# Patient Record
Sex: Male | Born: 1995 | Race: Black or African American | Hispanic: No | Marital: Single | Smoking: Never smoker
Health system: Southern US, Community
[De-identification: ages and names within clinical notes are randomized; demographics above are authoritative.]

---

## 2018-05-29 ENCOUNTER — Emergency Department (HOSPITAL_COMMUNITY)
Admission: EM | Admit: 2018-05-29 | Discharge: 2018-05-29 | Disposition: A | Discharge status: Home / Self Care | Payer: Self-pay | Attending: Emergency Medicine | Admitting: Emergency Medicine

## 2018-05-29 ENCOUNTER — Encounter (HOSPITAL_COMMUNITY): Payer: Self-pay

## 2018-05-29 ENCOUNTER — Emergency Department (HOSPITAL_COMMUNITY): Payer: Self-pay

## 2018-05-29 DIAGNOSIS — K219 Gastro-esophageal reflux disease without esophagitis: Secondary | ICD-10-CM | POA: Insufficient documentation

## 2018-05-29 DIAGNOSIS — R079 Chest pain, unspecified: Secondary | ICD-10-CM

## 2018-05-29 MED ORDER — RANITIDINE HCL 150 MG PO TABS: 150.0000 mg | ORAL_TABLET | Freq: Two times a day (BID) | ORAL | 0 refills | Status: DC

## 2018-05-29 MED ORDER — GI COCKTAIL ~~LOC~~
30.0000 mL | Freq: Once | ORAL | Status: AC
  Administered 2018-05-29: 30 mL via ORAL
  Filled 2018-05-29: qty 30

## 2018-05-29 NOTE — ED Provider Notes (Signed)
[facility]-EMERGENCY DEPT Provider Note   CSN: [id number] Arrival date & time: 05/29/18  0031     History   Chief Complaint Chief Complaint  Patient presents with  . knot on chest    HPI Jay Wang is a 23 y.o. male.  Patient presents to the ER for evaluation of chest discomfort.  Patient reports that for the last couple of days he has had a sensation of feeling like there is a knot or a bubble in his chest.  Sometimes he feels as when he eats or drinks, feels like the food is not going down completely.  Symptoms come and go.  No shortness of breath.  He did try Pepto-Bismol and Tums without improvement.  He does not have a history of peptic ulcer disease or GERD.     History reviewed. No pertinent past medical history.  There are no active problems to display for this patient.   History reviewed. No pertinent surgical history.      Home Medications    Prior to Admission medications   Medication Sig Start Date End Date Taking? Authorizing Provider  ranitidine (ZANTAC) 150 MG tablet Take 1 tablet (150 mg total) by mouth 2 (two) times daily. 05/29/18   Gilda Crease, MD    Family History History reviewed. No pertinent family history.  Social History Social History   Tobacco Use  . Smoking status: Never Smoker  . Smokeless tobacco: Never Used  Substance Use Topics  . Alcohol use: Never    Frequency: Never  . Drug use: Never     Allergies   Patient has no allergy information on record.   Review of Systems Review of Systems  Respiratory: Negative for cough and shortness of breath.   Cardiovascular: Positive for chest pain.  Gastrointestinal: Negative for abdominal pain.  All other systems reviewed and are negative.    Physical Exam Updated Vital Signs BP (!) 151/89 (BP Location: Left Arm)   Pulse 84   Temp 98.3 F (36.8 C) (Oral)   Resp 16   SpO2 100%   Physical Exam  Constitutional: He is oriented to  person, place, and time. He appears well-developed and well-nourished. No distress.  HENT:  Head: Normocephalic and atraumatic.  Right Ear: Hearing normal.  Left Ear: Hearing normal.  Nose: Nose normal.  Mouth/Throat: Oropharynx is clear and moist and mucous membranes are normal.  Eyes: Pupils are equal, round, and reactive to light. Conjunctivae and EOM are normal.  Neck: Normal range of motion. Neck supple.  Cardiovascular: Regular rhythm, S1 normal and S2 normal. Exam reveals no gallop and no friction rub.  No murmur heard. Pulmonary/Chest: Effort normal and breath sounds normal. No respiratory distress. He exhibits no tenderness.  Abdominal: Soft. Normal appearance and bowel sounds are normal. There is no hepatosplenomegaly. There is no tenderness. There is no rebound, no guarding, no tenderness at McBurney's point and negative Murphy's sign. No hernia.  Musculoskeletal: Normal range of motion.  Neurological: He is alert and oriented to person, place, and time. He has normal strength. No cranial nerve deficit or sensory deficit. Coordination normal. GCS eye subscore is 4. GCS verbal subscore is 5. GCS motor subscore is 6.  Skin: Skin is warm, dry and intact. No rash noted. No cyanosis.  Psychiatric: He has a normal mood and affect. His speech is normal and behavior is normal. Thought content normal.  Nursing note and vitals reviewed.    ED Treatments / Results  Labs (  all labs ordered are listed, but only abnormal results are displayed) Labs Reviewed - No data to display  EKG None  Radiology Dg Chest 2 View  Result Date: 05/29/2018 CLINICAL DATA:  Acute onset of mid chest discomfort. EXAM: CHEST - 2 VIEW COMPARISON:  None. FINDINGS: The lungs are well-aerated and clear. There is no evidence of focal opacification, pleural effusion or pneumothorax. The heart is normal in size; the mediastinal contour is within normal limits. No acute osseous abnormalities are seen. IMPRESSION: No  acute cardiopulmonary process seen. Electronically Signed   By: Roanna Raider M.D.   On: 05/29/2018 02:21    Procedures Procedures (including critical care time)  Medications Ordered in ED Medications  gi cocktail (Maalox,Lidocaine,Donnatal) (30 mLs Oral Given 05/29/18 0217)     Initial Impression / Assessment and Plan / ED Course  I have reviewed the triage vital signs and the nursing notes.  Pertinent labs & imaging results that were available during my care of the patient were reviewed by me and considered in my medical decision making (see chart for details).     Patient is well-appearing.  He is experiencing intermittent chest discomfort.  This is related to eating and drinking mostly.  He does not have a history of peptic ulcer disease, no hematemesis or melanotic stools.  Abdominal exam is benign.  Lungs are clear, no shortness of breath.  No risk factors for heart disease.  Oxygenating normally, no tachycardia, no pleuritic chest pain no concern for PE.  Chest x-ray is unremarkable.  Suspect that this is GI related, possibly GERD.  Will initiate treatment, follow-up as needed.  Final Clinical Impressions(s) / ED Diagnoses   Final diagnoses:  Chest pain due to GERD    ED Discharge Orders        Ordered    ranitidine (ZANTAC) 150 MG tablet  2 times daily     05/29/18 0249       Gilda Crease, MD 05/29/18 [phone]

## 2018-05-29 NOTE — ED Triage Notes (Signed)
Pt complains of a sore knot on his left chest for two days No injury noted Pt denies any drainage

## 2019-03-21 ENCOUNTER — Encounter (HOSPITAL_COMMUNITY): Payer: Self-pay

## 2019-03-21 ENCOUNTER — Emergency Department (HOSPITAL_COMMUNITY)
Admission: EM | Admit: 2019-03-21 | Discharge: 2019-03-21 | Disposition: A | Discharge status: Home / Self Care | Payer: Self-pay | Attending: Emergency Medicine | Admitting: Emergency Medicine

## 2019-03-21 ENCOUNTER — Other Ambulatory Visit: Payer: Self-pay

## 2019-03-21 DIAGNOSIS — R200 Anesthesia of skin: Secondary | ICD-10-CM

## 2019-03-21 DIAGNOSIS — R202 Paresthesia of skin: Secondary | ICD-10-CM | POA: Insufficient documentation

## 2019-03-21 DIAGNOSIS — I10 Essential (primary) hypertension: Secondary | ICD-10-CM | POA: Insufficient documentation

## 2019-03-21 NOTE — Discharge Instructions (Addendum)
If numbness and tingling persist make an appointment to follow-up with neurology.  Or if symptoms worsen.  Blood pressure elevated today recommend having that rechecked.  Normal blood pressure should be top number below 140.  And bottom number below 90.  Information on wellness clinic for follow-up provided.

## 2019-03-21 NOTE — ED Triage Notes (Signed)
Pt states he has been having numbness on his left side , mostly in his calf, since Monday. Pt ambulatory in triage. 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

## 2019-03-21 NOTE — ED Provider Notes (Signed)
[facility]-EMERGENCY DEPT Provider Note   CSN: [id number] Arrival date & time: 03/21/19  0840    History   Chief Complaint Chief Complaint  Patient presents with  . Numbness    HPI Jay Wang is a 24 y.o. male.     Patient with complaint of numbness and tingling to left upper extremity and left lower extremity since Monday.  Thought maybe it was due to some work he did with his father.  But his strength is good.  There is no pain in the neck no significant pain in the low back.  Little bit of a stiffness in the low back but he felt that that was just due to working out.  No visual changes no eye pain no fevers no headache no facial weakness no numbness anywhere else.  No prior history of anything similar.  No head injury no injury to the neck.  No neck stiffness or neck pain.     History reviewed. No pertinent past medical history.  There are no active problems to display for this patient.   History reviewed. No pertinent surgical history.      Home Medications    Prior to Admission medications   Medication Sig Start Date End Date Taking? Authorizing Provider  ranitidine (ZANTAC) 150 MG tablet Take 1 tablet (150 mg total) by mouth 2 (two) times daily. 05/29/18   Gilda Crease, MD    Family History No family history on file.  Social History Social History   Tobacco Use  . Smoking status: Never Smoker  . Smokeless tobacco: Never Used  Substance Use Topics  . Alcohol use: Never    Frequency: Never  . Drug use: Never     Allergies   Patient has no known allergies.   Review of Systems Review of Systems  Constitutional: Negative for chills and fever.  HENT: Negative for congestion, rhinorrhea and sore throat.   Eyes: Negative for photophobia, pain, redness and visual disturbance.  Respiratory: Negative for cough and shortness of breath.   Cardiovascular: Negative for chest pain and leg swelling.  Gastrointestinal:  Negative for abdominal pain, diarrhea, nausea and vomiting.  Genitourinary: Negative for dysuria.  Musculoskeletal: Negative for back pain, neck pain and neck stiffness.  Skin: Negative for rash.  Neurological: Positive for numbness. Negative for dizziness, tremors, seizures, syncope, facial asymmetry, speech difficulty, weakness, light-headedness and headaches.  Hematological: Does not bruise/bleed easily.  Psychiatric/Behavioral: Negative for confusion.     Physical Exam Updated Vital Signs BP (!) 156/80 (BP Location: Left Arm)   Pulse 85   Temp 99.3 F (37.4 C) (Oral)   Resp 16   Ht 1.905 m (6\' 3" )   Wt 86.2 kg   SpO2 100%   BMI 23.75 kg/m   Physical Exam Vitals signs and nursing note reviewed.  Constitutional:      General: He is not in acute distress.    Appearance: Normal appearance. He is well-developed.  HENT:     Head: Normocephalic and atraumatic.     Mouth/Throat:     Mouth: Mucous membranes are moist.     Pharynx: Oropharynx is clear.  Eyes:     Extraocular Movements: Extraocular movements intact.     Conjunctiva/sclera: Conjunctivae normal.     Pupils: Pupils are equal, round, and reactive to light.  Neck:     Musculoskeletal: Normal range of motion and neck supple. No neck rigidity or muscular tenderness.  Cardiovascular:     Rate and  Rhythm: Normal rate and regular rhythm.     Heart sounds: No murmur.  Pulmonary:     Effort: Pulmonary effort is normal. No respiratory distress.     Breath sounds: Normal breath sounds.  Abdominal:     General: Bowel sounds are normal.     Palpations: Abdomen is soft.     Tenderness: There is no abdominal tenderness.  Musculoskeletal:        General: No swelling, tenderness, deformity or signs of injury.     Comments: Radial pulse left side 2+.  Dorsalis pedis pulse left side 1-2+.  No calf tenderness.  No leg swelling.  No tenderness to palpation to the low back.  Good range of motion.  Skin:    General: Skin is warm  and dry.     Capillary Refill: Capillary refill takes less than 2 seconds.     Findings: No rash.  Neurological:     General: No focal deficit present.     Mental Status: He is alert.     Cranial Nerves: No cranial nerve deficit.     Sensory: No sensory deficit.     Motor: No weakness.     Coordination: Coordination normal.     Gait: Gait normal.      ED Treatments / Results  Labs (all labs ordered are listed, but only abnormal results are displayed) Labs Reviewed - No data to display  EKG None  Radiology No results found.  Procedures Procedures (including critical care time)  Medications Ordered in ED Medications - No data to display   Initial Impression / Assessment and Plan / ED Course  I have reviewed the triage vital signs and the nursing notes.  Pertinent labs & imaging results that were available during my care of the patient were reviewed by me and considered in my medical decision making (see chart for details).       Patient with complaint of the numbness and tingling on left upper extremity left lower extremity since Monday.  No weakness no headaches no fevers no visual changes no speech problems.  No other complaints.  No chest pain no shortness of breath.  Neuro exam without any acute findings.  Pulses to the left upper extremity radial pulses 2+.  Dorsalis pedis pulse lower extremity intact.  Symptoms do not seem to be consistent with any strokelike event.  Typically since its numbness and tingling.  Sensation grossly intact.  No concerns for pulmonary embolus.  No history of any neck injury or head injury.  No neck pain.  No low back pain of any significance.  Patient does have some soreness from working out.  In addition patient's blood pressure was elevated here.  Gave referral to wellness clinic to have blood pressure rechecked.  Given referral to neurology if symptoms do not resolve or if they progress.  Did discuss that symptoms like this sometimes  can be early onset some neurological processes like multiple sclerosis.  See no reason for CT head or MRI at this time.  Also no concern for any kind of spinal cord injury.   Final Clinical Impressions(s) / ED Diagnoses   Final diagnoses:  Numbness and tingling of left arm and leg  Hypertension, unspecified type    ED Discharge Orders    None       Vanetta Mulders, MD 03/21/19 [phone]

## 2019-05-07 ENCOUNTER — Other Ambulatory Visit: Payer: Self-pay

## 2019-05-07 ENCOUNTER — Encounter: Payer: Self-pay | Admitting: Neurology

## 2019-05-07 ENCOUNTER — Telehealth (INDEPENDENT_AMBULATORY_CARE_PROVIDER_SITE_OTHER): Payer: Self-pay | Admitting: Neurology

## 2019-05-07 VITALS — Ht 75.0 in | Wt 195.0 lb

## 2019-05-07 DIAGNOSIS — R202 Paresthesia of skin: Secondary | ICD-10-CM

## 2019-05-07 NOTE — Progress Notes (Signed)
New Patient Virtual Visit via Video Note The purpose of this virtual visit is to provide medical care while limiting exposure to the novel coronavirus.    Consent was obtained for video visit:  Yes.   Answered questions that patient had about telehealth interaction:  Yes.   I discussed the limitations, risks, security and privacy concerns of performing an evaluation and management service by telemedicine. I also discussed with the patient that there may be a patient responsible charge related to this service. The patient expressed understanding and agreed to proceed.  Pt location: Home Physician Location: office Name of referring provider:  Vanetta Mulders, MD I connected with Jay Wang at patients initiation/request on 05/07/2019 at  2:00 PM EDT by video enabled telemedicine application and verified that I am speaking with the correct person using two identifiers. Pt MRN:  [id number] Pt DOB:  [birth year] Video Participants:  Jay Wang    History of Present Illness: Jay Wang is a 24 y.o. right-handed African American male with no prior medical history presenting for evaluation of numbness/tingling of the left arm and leg, which started in March.  He woke up with his left arm asleep.  He did not have weakness, imbalance, or facial numbness/tingling. He felt that it may have been related to work he did with his father. Symptoms self-resolved within a few days and have no recurred.  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  No prior history of neurological symptoms.   Past medical history:  None  Past Surgical history:  None  Medications:  Outpatient Encounter Medications as of 05/07/2019  Medication Sig  . [DISCONTINUED] ranitidine (ZANTAC) 150 MG tablet Take 1 tablet (150 mg total) by mouth 2 (two) times daily.   No facility-administered encounter medications on file as of 05/07/2019.     Allergies: No Known  Allergies  Family History: Family History  Problem Relation Age of Onset  . Healthy Mother   . Diabetes Father     Social History: Social History   Tobacco Use  . Smoking status: Never Smoker  . Smokeless tobacco: Never Used  Substance Use Topics  . Alcohol use: Never    Frequency: Never  . Drug use: Never   Social History   Social History Narrative   Patient is right-handed. He lives with parents in a one level home. He does not exercise.    Review of Systems:  CONSTITUTIONAL: No fevers, chills, night sweats, or weight loss.   EYES: No visual changes or eye pain ENT: No hearing changes.  No history of nose bleeds.   RESPIRATORY: No cough, wheezing and shortness of breath.   CARDIOVASCULAR: Negative for chest pain, and palpitations.   GI: Negative for abdominal discomfort, blood in stools or black stools.  No recent change in bowel habits.   GU:  No history of incontinence.   MUSCLOSKELETAL: No history of joint pain or swelling.  No myalgias.   SKIN: Negative for lesions, rash, and itching.   HEMATOLOGY/ONCOLOGY: Negative for prolonged bleeding, bruising easily, and swollen nodes.  No history of cancer.   ENDOCRINE: Negative for cold or heat intolerance, polydipsia or goiter.   PSYCH:  No depression or anxiety symptoms.   NEURO: As Above.   Vital Signs:  Ht 6\' 3"  (1.905 m)   Wt 195 lb (88.5 kg)   BMI 24.37 kg/m    General Medical Exam:  Well appearing, comfortable.  Nonlabored breathing.  Neurological Exam: MENTAL STATUS including orientation to time, place, person, recent and remote memory, attention span and concentration, language, and fund of knowledge is normal.  Speech is not dysarthric.  CRANIAL NERVES:  Normal conjugate, extra-ocular eye movements in all directions of gaze.  No ptosis.  Normal facial symmetry and movements.  Normal shoulder shrug and head rotation.  Tongue is midline.  MOTOR:  Antigravity in all extremities.  No abnormal movements.     COORDINATION/GAIT: Intact rapid alternating movements bilaterally.    Gait narrow based and stable.   IMPRESSION: Episode of left arm and leg tingling, resolved.  He has not had return of symptoms and is doing well. No testing indicated at this time.  If symptoms return, recommend evaluation in the office to determine whether MRI brain is the next step.   Follow Up Instructions:  I discussed the assessment and treatment plan with the patient. The patient was provided an opportunity to ask questions and all were answered. The patient agreed with the plan and demonstrated an understanding of the instructions.   The patient was advised to call back or seek an in-person evaluation if the symptoms worsen or if the condition fails to improve as anticipated.   [name], DO

## 2019-05-13 ENCOUNTER — Emergency Department (HOSPITAL_COMMUNITY)
Admission: EM | Admit: 2019-05-13 | Discharge: 2019-05-13 | Disposition: A | Discharge status: Home / Self Care | Payer: Self-pay | Attending: Emergency Medicine | Admitting: Emergency Medicine

## 2019-05-13 ENCOUNTER — Emergency Department (HOSPITAL_COMMUNITY): Payer: Self-pay

## 2019-05-13 ENCOUNTER — Other Ambulatory Visit: Payer: Self-pay

## 2019-05-13 ENCOUNTER — Encounter (HOSPITAL_COMMUNITY): Payer: Self-pay | Admitting: Obstetrics and Gynecology

## 2019-05-13 DIAGNOSIS — R109 Unspecified abdominal pain: Secondary | ICD-10-CM | POA: Insufficient documentation

## 2019-05-13 LAB — CBC WITH DIFFERENTIAL/PLATELET
Abs Immature Granulocytes: 0.01 10*3/uL (ref 0.00–0.07)
Basophils Absolute: 0 10*3/uL (ref 0.0–0.1)
Basophils Relative: 1 %
Eosinophils Absolute: 0.2 10*3/uL (ref 0.0–0.5)
Eosinophils Relative: 3 %
HCT: 46.1 % (ref 39.0–52.0)
Hemoglobin: 14.6 g/dL (ref 13.0–17.0)
Immature Granulocytes: 0 %
Lymphocytes Relative: 22 %
Lymphs Abs: 1.4 10*3/uL (ref 0.7–4.0)
MCH: 26.7 pg (ref 26.0–34.0)
MCHC: 31.7 g/dL (ref 30.0–36.0)
MCV: 84.3 fL (ref 80.0–100.0)
Monocytes Absolute: 0.3 10*3/uL (ref 0.1–1.0)
Monocytes Relative: 5 %
Neutro Abs: 4.4 10*3/uL (ref 1.7–7.7)
Neutrophils Relative %: 69 %
Platelets: 342 10*3/uL (ref 150–400)
RBC: 5.47 MIL/uL (ref 4.22–5.81)
RDW: 12.9 % (ref 11.5–15.5)
WBC: 6.3 10*3/uL (ref 4.0–10.5)
nRBC: 0 % (ref 0.0–0.2)

## 2019-05-13 LAB — COMPREHENSIVE METABOLIC PANEL
ALT: 22 U/L (ref 0–44)
AST: 21 U/L (ref 15–41)
Albumin: 5 g/dL (ref 3.5–5.0)
Alkaline Phosphatase: 101 U/L (ref 38–126)
Anion gap: 10 (ref 5–15)
BUN: 13 mg/dL (ref 6–20)
CO2: 23 mmol/L (ref 22–32)
Calcium: 9.2 mg/dL (ref 8.9–10.3)
Chloride: 108 mmol/L (ref 98–111)
Creatinine, Ser: 0.89 mg/dL (ref 0.61–1.24)
GFR calc Af Amer: >60 mL/min (ref >60)
GFR calc non Af Amer: >60 mL/min (ref >60)
Glucose, Bld: 85 mg/dL (ref 70–99)
Potassium: 3.3 mmol/L — ABNORMAL LOW (ref 3.5–5.1)
Sodium: 141 mmol/L (ref 135–145)
Total Bilirubin: 0.6 mg/dL (ref 0.3–1.2)
Total Protein: 8 g/dL (ref 6.5–8.1)

## 2019-05-13 LAB — LIPASE, BLOOD: Lipase: 26 U/L (ref 11–51)

## 2019-05-13 MED ORDER — DICYCLOMINE HCL 20 MG PO TABS: 20.0000 mg | ORAL_TABLET | Freq: Two times a day (BID) | ORAL | 0 refills | Status: AC | PRN

## 2019-05-13 MED ORDER — FAMOTIDINE 20 MG PO TABS: 20.0000 mg | ORAL_TABLET | Freq: Two times a day (BID) | ORAL | 0 refills | Status: DC

## 2019-05-13 NOTE — ED Provider Notes (Signed)
[facility]-EMERGENCY DEPT Provider Note   CSN: [id number] Arrival date & time: 05/13/19  1830    History   Chief Complaint Chief Complaint  Patient presents with  . Abdominal Pain    HPI Jay Wang is a 24 y.o. male.     HPI Patient states he has had episodic left-sided abdominal pain for the last week.  Patient reports not necessarily related to food intake.  Denies NSAID use.  No nausea, vomiting or diarrhea.  Denies constipation.  No melanotic or grossly bloody stools.  No fever or chills.  No previous abdominal surgeries.  No urinary symptoms. History reviewed. No pertinent past medical history.  There are no active problems to display for this patient.   History reviewed. No pertinent surgical history.      Home Medications    Prior to Admission medications   Medication Sig Start Date End Date Taking? Authorizing Provider  dicyclomine (BENTYL) 20 MG tablet Take 1 tablet (20 mg total) by mouth 2 (two) times daily as needed for spasms. 05/13/19   Loren RacerYelverton, Arihanna Estabrook, MD  famotidine (PEPCID) 20 MG tablet Take 1 tablet (20 mg total) by mouth 2 (two) times daily. 05/13/19   Loren RacerYelverton, Scotty Weigelt, MD    Family History Family History  Problem Relation Age of Onset  . Healthy Mother   . Diabetes Father     Social History Social History   Tobacco Use  . Smoking status: Never Smoker  . Smokeless tobacco: Never Used  Substance Use Topics  . Alcohol use: Yes    Frequency: Never    Comment: Social  . Drug use: Never     Allergies   Patient has no known allergies.   Review of Systems Review of Systems  Constitutional: Negative for chills and fever.  Eyes: Negative for visual disturbance.  Respiratory: Negative for cough and shortness of breath.   Gastrointestinal: Positive for abdominal pain. Negative for constipation, diarrhea, nausea and vomiting.  Genitourinary: Negative for dysuria, flank pain, frequency and hematuria.   Musculoskeletal: Negative for back pain, myalgias and neck pain.  Skin: Negative for rash and wound.  Neurological: Negative for dizziness, weakness, light-headedness, numbness and headaches.  All other systems reviewed and are negative.    Physical Exam Updated Vital Signs BP (!) 141/86 (BP Location: Left Arm) Comment: Simultaneous filing. User may not have seen previous data.  Pulse 77 Comment: Simultaneous filing. User may not have seen previous data.  Temp 98.3 F (36.8 C) (Oral)   Resp 16   SpO2 98% Comment: Simultaneous filing. User may not have seen previous data.  Physical Exam Vitals signs and nursing note reviewed.  Constitutional:      Appearance: Normal appearance. He is well-developed.  HENT:     Head: Normocephalic and atraumatic.     Nose: Nose normal.     Mouth/Throat:     Mouth: Mucous membranes are moist.  Eyes:     Extraocular Movements: Extraocular movements intact.     Pupils: Pupils are equal, round, and reactive to light.  Neck:     Musculoskeletal: Normal range of motion and neck supple. No neck rigidity or muscular tenderness.     Vascular: No carotid bruit.  Cardiovascular:     Rate and Rhythm: Normal rate and regular rhythm.     Heart sounds: No murmur. No friction rub. No gallop.   Pulmonary:     Effort: Pulmonary effort is normal. No respiratory distress.     Breath sounds:  Normal breath sounds. No stridor. No wheezing, rhonchi or rales.  Chest:     Chest wall: No tenderness.  Abdominal:     General: Bowel sounds are normal. There is distension.     Palpations: Abdomen is soft.     Tenderness: There is no abdominal tenderness. There is no right CVA tenderness, left CVA tenderness, guarding or rebound.     Comments: No tenderness to palpation.  Mildly distended.  Musculoskeletal: Normal range of motion.        General: No swelling, tenderness, deformity or signs of injury.     Right lower leg: No edema.     Left lower leg: No edema.   Lymphadenopathy:     Cervical: No cervical adenopathy.  Skin:    General: Skin is warm and dry.     Capillary Refill: Capillary refill takes less than 2 seconds.     Findings: No erythema or rash.  Neurological:     General: No focal deficit present.     Mental Status: He is alert and oriented to person, place, and time.  Psychiatric:        Mood and Affect: Mood normal.        Behavior: Behavior normal.      ED Treatments / Results  Labs (all labs ordered are listed, but only abnormal results are displayed) Labs Reviewed  COMPREHENSIVE METABOLIC PANEL - Abnormal; Notable for the following components:      Result Value   Potassium 3.3 (*)    All other components within normal limits  CBC WITH DIFFERENTIAL/PLATELET  LIPASE, BLOOD    EKG None  Radiology No results found.  Procedures Procedures (including critical care time)  Medications Ordered in ED Medications - No data to display   Initial Impression / Assessment and Plan / ED Course  I have reviewed the triage vital signs and the nursing notes.  Pertinent labs & imaging results that were available during my care of the patient were reviewed by me and considered in my medical decision making (see chart for details).       KUB/labs with no concerning findings.  Abdominal exam is benign.  Question bowel spasms versus gastritis.  Will give course of Pepcid and Bentyl as needed.  Return precautions given.  Final Clinical Impressions(s) / ED Diagnoses   Final diagnoses:  Abdominal pain, unspecified abdominal location    ED Discharge Orders         Ordered    dicyclomine (BENTYL) 20 MG tablet  2 times daily PRN     05/13/19 2009    famotidine (PEPCID) 20 MG tablet  2 times daily     05/13/19 2009           Loren Racer, MD 05/16/19 [phone]

## 2019-05-13 NOTE — ED Triage Notes (Signed)
Pt reports he has abdominal pain on the left side of the umbilical area. Pt reports it hurts more when he sits down. Pt denies N/V/D. Pt reports he has no trouble with his BM.  Pt reports that "Sometimes eating makes it better"

## 2019-06-03 ENCOUNTER — Ambulatory Visit: Payer: Self-pay | Admitting: Neurology

## 2019-06-12 ENCOUNTER — Other Ambulatory Visit: Payer: Self-pay

## 2019-06-12 ENCOUNTER — Emergency Department (HOSPITAL_COMMUNITY): Payer: Self-pay

## 2019-06-12 ENCOUNTER — Encounter (HOSPITAL_COMMUNITY): Payer: Self-pay | Admitting: Emergency Medicine

## 2019-06-12 ENCOUNTER — Emergency Department (HOSPITAL_COMMUNITY)
Admission: EM | Admit: 2019-06-12 | Discharge: 2019-06-12 | Disposition: A | Discharge status: Home / Self Care | Payer: Self-pay | Attending: Emergency Medicine | Admitting: Emergency Medicine

## 2019-06-12 DIAGNOSIS — R079 Chest pain, unspecified: Secondary | ICD-10-CM | POA: Insufficient documentation

## 2019-06-12 LAB — CBC
HCT: 47.6 % (ref 39.0–52.0)
Hemoglobin: 15.3 g/dL (ref 13.0–17.0)
MCH: 27.1 pg (ref 26.0–34.0)
MCHC: 32.1 g/dL (ref 30.0–36.0)
MCV: 84.2 fL (ref 80.0–100.0)
Platelets: 346 10*3/uL (ref 150–400)
RBC: 5.65 MIL/uL (ref 4.22–5.81)
RDW: 12.5 % (ref 11.5–15.5)
WBC: 5.5 10*3/uL (ref 4.0–10.5)
nRBC: 0 % (ref 0.0–0.2)

## 2019-06-12 LAB — BASIC METABOLIC PANEL
Anion gap: 6 (ref 5–15)
BUN: 9 mg/dL (ref 6–20)
CO2: 25 mmol/L (ref 22–32)
Calcium: 9 mg/dL (ref 8.9–10.3)
Chloride: 107 mmol/L (ref 98–111)
Creatinine, Ser: 0.88 mg/dL (ref 0.61–1.24)
GFR calc Af Amer: >60 mL/min (ref >60)
GFR calc non Af Amer: >60 mL/min (ref >60)
Glucose, Bld: 98 mg/dL (ref 70–99)
Potassium: 3.3 mmol/L — ABNORMAL LOW (ref 3.5–5.1)
Sodium: 138 mmol/L (ref 135–145)

## 2019-06-12 LAB — TROPONIN I: Troponin I: <0.03 ng/mL (ref <0.03)

## 2019-06-12 NOTE — ED Triage Notes (Signed)
Pt c/o intermittent chest pains since he was seen here back in May. Denies anything specific that makes pains worse. hasnt taken any medications for the pain.

## 2019-06-13 NOTE — ED Provider Notes (Signed)
[facility]-EMERGENCY DEPT Provider Note   CSN: [id number] Arrival date & time: 06/12/19  1452     History   Chief Complaint Chief Complaint  Patient presents with  . Chest Pain    HPI Jay Wang is a 24 y.o. male.     HPI   24yM with CP. Across costal margin. Worse on L side. Intermittent. Seemingly comes and goes randomly. Lasts seconds. No clear association with exertion, movement or eating. No n/v. No respiratory complaints. No fever or chills.   History reviewed. No pertinent past medical history.  There are no active problems to display for this patient.  History reviewed. No pertinent surgical history.    Home Medications    Prior to Admission medications   Medication Sig Start Date End Date Taking? Authorizing Provider  dicyclomine (BENTYL) 20 MG tablet Take 1 tablet (20 mg total) by mouth 2 (two) times daily as needed for spasms. 05/13/19  Yes Loren RacerYelverton, David, MD  famotidine (PEPCID) 20 MG tablet Take 1 tablet (20 mg total) by mouth 2 (two) times daily. 05/13/19  Yes Loren RacerYelverton, David, MD    Family History Family History  Problem Relation Age of Onset  . Healthy Mother   . Diabetes Father     Social History Social History   Tobacco Use  . Smoking status: Never Smoker  . Smokeless tobacco: Never Used  Substance Use Topics  . Alcohol use: Yes    Frequency: Never    Comment: Social  . Drug use: Never     Allergies   Patient has no known allergies.   Review of Systems Review of Systems  All systems reviewed and negative, other than as noted in HPI.  Physical Exam Updated Vital Signs BP (!) 143/85   Pulse 80   Temp 99.3 F (37.4 C) (Oral)   Resp 16   Ht 6\' 2"  (1.88 m)   Wt 86.2 kg   SpO2 100%   BMI 24.39 kg/m   Physical Exam Vitals signs and nursing note reviewed.  Constitutional:      General: He is not in acute distress.    Appearance: He is well-developed.  HENT:     Head: Normocephalic and  atraumatic.  Eyes:     General:        Right eye: No discharge.        Left eye: No discharge.     Conjunctiva/sclera: Conjunctivae normal.  Neck:     Musculoskeletal: Neck supple.  Cardiovascular:     Rate and Rhythm: Normal rate and regular rhythm.     Heart sounds: Normal heart sounds. No murmur. No friction rub. No gallop.   Pulmonary:     Effort: Pulmonary effort is normal. No respiratory distress.     Breath sounds: Normal breath sounds.  Abdominal:     General: There is no distension.     Palpations: Abdomen is soft.     Tenderness: There is no abdominal tenderness.  Musculoskeletal:        General: No tenderness.     Comments: Lower extremities symmetric as compared to each other. No calf tenderness. Negative Homan's. No palpable cords.   Skin:    General: Skin is warm and dry.  Neurological:     Mental Status: He is alert.  Psychiatric:        Behavior: Behavior normal.        Thought Content: Thought content normal.      ED Treatments / Results  Labs (all labs ordered are listed, but only abnormal results are displayed) Labs Reviewed  BASIC METABOLIC PANEL - Abnormal; Notable for the following components:      Result Value   Potassium 3.3 (*)    All other components within normal limits  CBC  TROPONIN I    EKG EKG Interpretation  Date/Time:  Friday June 12 2019 15:03:09 EDT Ventricular Rate:  77 PR Interval:    QRS Duration: 91 QT Interval:  346 QTC Calculation: 392 R Axis:   117 Text Interpretation:  Sinus or ectopic atrial rhythm Right axis deviation Nonspecific T abnormalities, anterior and inferior leads Confirmed by Virgel Manifold [phone]) on 06/12/2019 3:20:35 PM   Radiology Dg Chest 2 View  Result Date: 06/12/2019 CLINICAL DATA:  Intermittent chest pain. EXAM: CHEST - 2 VIEW COMPARISON:  May 29, 2018 FINDINGS: The heart size and mediastinal contours are within normal limits. Both lungs are clear. The visualized skeletal structures are  unremarkable. IMPRESSION: No active cardiopulmonary disease. Electronically Signed   By: Dorise Bullion III M.D   On: 06/12/2019 16:29    Procedures Procedures (including critical care time)  Medications Ordered in ED Medications - No data to display   Initial Impression / Assessment and Plan / ED Course  I have reviewed the triage vital signs and the nursing notes.  Pertinent labs & imaging results that were available during my care of the patient were reviewed by me and considered in my medical decision making (see chart for details).  24yM with CP. Seems atypical for ACS. Doubt PE, dissection or other emergent process.   Final Clinical Impressions(s) / ED Diagnoses   Final diagnoses:  Chest pain, unspecified type    ED Discharge Orders    None       Virgel Manifold, MD 06/13/19 2101

## 2019-06-29 ENCOUNTER — Emergency Department (HOSPITAL_COMMUNITY)
Admission: EM | Admit: 2019-06-29 | Discharge: 2019-06-30 | Disposition: A | Discharge status: Home / Self Care | Payer: Self-pay | Attending: Emergency Medicine | Admitting: Emergency Medicine

## 2019-06-29 ENCOUNTER — Encounter (HOSPITAL_COMMUNITY): Payer: Self-pay

## 2019-06-29 DIAGNOSIS — G44209 Tension-type headache, unspecified, not intractable: Secondary | ICD-10-CM | POA: Insufficient documentation

## 2019-06-29 NOTE — ED Triage Notes (Signed)
Pt complains of a left sided headache since he was seen on the 12th of June, he states that it's a dull pain, no vomiting, sometimes nauseated and hurts behind his eyes

## 2019-06-30 NOTE — Discharge Instructions (Addendum)
You may use over-the-counter Motrin (Ibuprofen), Acetaminophen (Tylenol), topical muscle creams such as SalonPas, Icy Hot, Bengay, etc. Please stretch, apply heat, and have massage therapy for additional assistance. ° °

## 2019-06-30 NOTE — ED Provider Notes (Signed)
[facility] Provider Note  CSN: [id number] Arrival date & time: 06/29/19 1856  Chief Complaint(s) Headache  HPI Jay Wang is a [age] y.o. male   The history is provided by the patient.  Headache Location: left occipital. Quality:  Dull and stabbing Radiates to:  Does not radiate Severity currently:  3/10 Onset quality:  Gradual Duration:  2 weeks Timing:  Intermittent Progression:  Resolved Chronicity:  New Context comment:  Notices mostly with certain neck positions Relieved by: heat, movement. Worsened by:  Neck movement Associated symptoms: no abdominal pain, no congestion, no cough, no dizziness, no facial pain, no fatigue, no fever, no loss of balance, no nausea, no numbness and no vomiting     Past Medical History History reviewed. No pertinent past medical history. There are no active problems to display for this patient.  Home Medication(s) Prior to Admission medications   Medication Sig Start Date End Date Taking? Authorizing Provider  dicyclomine (BENTYL) 20 MG tablet Take 1 tablet (20 mg total) by mouth 2 (two) times daily as needed for spasms. 05/13/19   Loren RacerYelverton, David, MD  famotidine (PEPCID) 20 MG tablet Take 1 tablet (20 mg total) by mouth 2 (two) times daily. 05/13/19   Loren RacerYelverton, David, MD                                                                                                                                    Past Surgical History History reviewed. No pertinent surgical history. Family History Family History  Problem Relation Age of Onset  . Healthy Mother   . Diabetes Father     Social History Social History   Tobacco Use  . Smoking status: Never Smoker  . Smokeless tobacco: Never Used  Substance Use Topics  . Alcohol use: Yes    Frequency: Never    Comment: Social  . Drug use: Never   Allergies Patient has no known allergies.  Review of Systems Review of Systems  Constitutional:  Negative for fatigue and fever.  HENT: Negative for congestion.   Respiratory: Negative for cough.   Gastrointestinal: Negative for abdominal pain, nausea and vomiting.  Neurological: Positive for headaches. Negative for dizziness, numbness and loss of balance.   All other systems are reviewed and are negative for acute change except as noted in the HPI  Physical Exam Vital Signs  I have reviewed the triage vital signs BP (!) 147/94 (BP Location: Right Arm)   Pulse 82   Temp 98.3 F (36.8 C) (Oral)   Resp 18   SpO2 100%   Physical Exam Vitals signs reviewed.  Constitutional:      General: He is not in acute distress.    Appearance: He is well-developed. He is not diaphoretic.  HENT:     Head: Normocephalic and atraumatic.     Jaw: No trismus.     Right Ear: Tympanic membrane and external ear  normal.     Left Ear: Tympanic membrane and external ear normal.     Nose: Nose normal.  Eyes:     General: No scleral icterus.    Conjunctiva/sclera: Conjunctivae normal.  Neck:     Musculoskeletal: Normal range of motion. Normal range of motion. No neck rigidity, spinous process tenderness or muscular tenderness.     Trachea: Phonation normal.  Cardiovascular:     Rate and Rhythm: Normal rate and regular rhythm.  Pulmonary:     Effort: Pulmonary effort is normal. No respiratory distress.     Breath sounds: No stridor.  Abdominal:     General: There is no distension.  Musculoskeletal: Normal range of motion.  Neurological:     Mental Status: He is alert and oriented to person, place, and time.     Sensory: Sensation is intact.     Motor: Motor function is intact.  Psychiatric:        Behavior: Behavior normal.     ED Results and Treatments Labs (all labs ordered are listed, but only abnormal results are displayed) Labs Reviewed - No data to display                                                                                                                       EKG   EKG Interpretation  Date/Time:    Ventricular Rate:    PR Interval:    QRS Duration:   QT Interval:    QTC Calculation:   R Axis:     Text Interpretation:        Radiology No results found.  Pertinent labs & imaging results that were available during my care of the patient were reviewed by me and considered in my medical decision making (see chart for details).  Medications Ordered in ED Medications - No data to display                                                                                                                                  Procedures Procedures  (including critical care time)  Medical Decision Making / ED Course I have reviewed the nursing notes for this encounter and the patient's prior records (if available in EHR or on provided paperwork).  Asymptomatic currently. No recent head trauma. No fever. Doubt meningitis. Doubt intracranial bleed. Doubt IIH. No indication for imaging.   Likely muscular.    Jay Wang  was evaluated in Emergency Department on 06/30/2019 for the symptoms described in the history of present illness. He was evaluated in the context of the global COVID-19 pandemic, which necessitated consideration that the patient might be at risk for infection with the SARS-CoV-2 virus that causes COVID-19. Institutional protocols and algorithms that pertain to the evaluation of patients at risk for COVID-19 are in a state of rapid change based on information released by regulatory bodies including the CDC and federal and state organizations. These policies and algorithms were followed during the patient's care in the ED.  Final Clinical Impression(s) / ED Diagnoses Final diagnoses:  Muscle tension headache    The patient appears reasonably screened and/or stabilized for discharge and I doubt any other medical condition or other [facility] requiring further screening, evaluation, or treatment in the ED at this time prior to discharge.   Disposition: Discharge  Condition: Good  I have discussed the results, Dx and Tx plan with the patient who expressed understanding and agree(s) with the plan. Discharge instructions discussed at great length. The patient was given strict return precautions who verbalized understanding of the instructions. No further questions at time of discharge.    ED Discharge Orders    None       Follow Up: Primary care provider  Schedule an appointment as soon as possible for a visit  If you do not have a primary care physician, contact [facility] at [phone] for referral      This chart was dictated using voice recognition software.  Despite best efforts to proofread,  errors can occur which can change the documentation meaning.   Fatima Blank, MD 06/30/19 [phone]

## 2019-08-29 ENCOUNTER — Encounter (HOSPITAL_COMMUNITY): Payer: Self-pay | Admitting: *Deleted

## 2019-08-29 ENCOUNTER — Emergency Department (HOSPITAL_COMMUNITY)
Admission: EM | Admit: 2019-08-29 | Discharge: 2019-08-29 | Disposition: A | Discharge status: Home / Self Care | Payer: Self-pay | Attending: Emergency Medicine | Admitting: Emergency Medicine

## 2019-08-29 ENCOUNTER — Other Ambulatory Visit: Payer: Self-pay

## 2019-08-29 DIAGNOSIS — K2900 Acute gastritis without bleeding: Secondary | ICD-10-CM | POA: Insufficient documentation

## 2019-08-29 DIAGNOSIS — M5412 Radiculopathy, cervical region: Secondary | ICD-10-CM | POA: Insufficient documentation

## 2019-08-29 LAB — CBC WITH DIFFERENTIAL/PLATELET
Abs Immature Granulocytes: 0.02 10*3/uL (ref 0.00–0.07)
Basophils Absolute: 0 10*3/uL (ref 0.0–0.1)
Basophils Relative: 0 %
Eosinophils Absolute: 0.1 10*3/uL (ref 0.0–0.5)
Eosinophils Relative: 1 %
HCT: 47.4 % (ref 39.0–52.0)
Hemoglobin: 15.3 g/dL (ref 13.0–17.0)
Immature Granulocytes: 0 %
Lymphocytes Relative: 14 %
Lymphs Abs: 0.9 10*3/uL (ref 0.7–4.0)
MCH: 27.2 pg (ref 26.0–34.0)
MCHC: 32.3 g/dL (ref 30.0–36.0)
MCV: 84.3 fL (ref 80.0–100.0)
Monocytes Absolute: 0.3 10*3/uL (ref 0.1–1.0)
Monocytes Relative: 5 %
Neutro Abs: 5.5 10*3/uL (ref 1.7–7.7)
Neutrophils Relative %: 80 %
Platelets: 332 10*3/uL (ref 150–400)
RBC: 5.62 MIL/uL (ref 4.22–5.81)
RDW: 13.2 % (ref 11.5–15.5)
WBC: 6.8 10*3/uL (ref 4.0–10.5)
nRBC: 0 % (ref 0.0–0.2)

## 2019-08-29 LAB — COMPREHENSIVE METABOLIC PANEL
ALT: 27 U/L (ref 0–44)
AST: 23 U/L (ref 15–41)
Albumin: 4.7 g/dL (ref 3.5–5.0)
Alkaline Phosphatase: 86 U/L (ref 38–126)
Anion gap: 7 (ref 5–15)
BUN: 11 mg/dL (ref 6–20)
CO2: 25 mmol/L (ref 22–32)
Calcium: 9.3 mg/dL (ref 8.9–10.3)
Chloride: 107 mmol/L (ref 98–111)
Creatinine, Ser: 0.96 mg/dL (ref 0.61–1.24)
GFR calc Af Amer: >60 mL/min (ref >60)
GFR calc non Af Amer: >60 mL/min (ref >60)
Glucose, Bld: 95 mg/dL (ref 70–99)
Potassium: 3.8 mmol/L (ref 3.5–5.1)
Sodium: 139 mmol/L (ref 135–145)
Total Bilirubin: 0.7 mg/dL (ref 0.3–1.2)
Total Protein: 8.2 g/dL — ABNORMAL HIGH (ref 6.5–8.1)

## 2019-08-29 LAB — URINALYSIS, ROUTINE W REFLEX MICROSCOPIC
Bacteria, UA: NONE SEEN
Bilirubin Urine: NEGATIVE
Glucose, UA: NEGATIVE mg/dL
Hgb urine dipstick: NEGATIVE
Ketones, ur: NEGATIVE mg/dL
Leukocytes,Ua: NEGATIVE
Nitrite: NEGATIVE
Protein, ur: NEGATIVE mg/dL
Specific Gravity, Urine: 1.02 (ref 1.005–1.030)
pH: 7 (ref 5.0–8.0)

## 2019-08-29 LAB — LIPASE, BLOOD: Lipase: 26 U/L (ref 11–51)

## 2019-08-29 MED ORDER — PREDNISONE 20 MG PO TABS: ORAL_TABLET | ORAL | 0 refills | Status: AC

## 2019-08-29 MED ORDER — FAMOTIDINE 20 MG PO TABS: 20.0000 mg | ORAL_TABLET | Freq: Two times a day (BID) | ORAL | 0 refills | Status: AC

## 2019-08-29 MED ORDER — METHOCARBAMOL 500 MG PO TABS: 500.0000 mg | ORAL_TABLET | Freq: Two times a day (BID) | ORAL | 0 refills | Status: AC

## 2019-08-29 MED ORDER — ALUM & MAG HYDROXIDE-SIMETH 200-200-20 MG/5ML PO SUSP
30.0000 mL | Freq: Once | ORAL | Status: AC
  Administered 2019-08-29: 30 mL via ORAL
  Filled 2019-08-29: qty 30

## 2019-08-29 MED ORDER — OMEPRAZOLE 20 MG PO CPDR: 20.0000 mg | DELAYED_RELEASE_CAPSULE | Freq: Every day | ORAL | 0 refills | Status: AC

## 2019-08-29 NOTE — ED Triage Notes (Signed)
Pt reports a tingling sensation in the left arm that began after pt got off work.  Pt describes it as if his arm "falls asleep."  Pt reports intermittent neck stiffness and along with chest discomfort.  Pt reports the neck and chest issue was evaluated the last time he was here and his symptoms have persisted since then. Pt a/o x 4 and ambulatory.

## 2019-08-29 NOTE — Discharge Instructions (Addendum)
1. Medications: prednisone, robaxin, pepcid, omeprazole, usual home medications 2. Treatment: rest, drink plenty of fluids, avoid spicy or greasy foods; eat several small meals per day 3. Follow Up: Please followup with your primary doctor in 2-3 days for discussion of your abdominal pain.  Please follow-up with Orthopedics about your neck pain. Please return to the ER for new or worsening symptoms including persistent vomiting, bloody emesis, numbness or weakness in any extremity.

## 2019-08-29 NOTE — ED Provider Notes (Signed)
[facility] DEPT Provider Note   CSN: [id number] Arrival date & time: 08/29/19  0038     History   Chief Complaint Chief Complaint  Patient presents with   Arm Pain    tingling to left arm    HPI Jay Wang is a 24 y.o. male with a hx of no major medical problems presents to the Emergency Department complaining of intermittent left arm paresthesias onset several weeks ago.  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  He reports the symptoms are worst at night when he lays down but sometimes he has them during the day as well.  He denies pain in the arm or weakness.  He is right-handed.  No treatments prior to arrival.  Patient reports several weeks ago he was evaluated for neck pain that was causing headaches.  He reports this has improved some but he does continue to have some neck pain lower and some shoulder pain, worst in the left posterior shoulder.  Patient reports occasionally he has some paresthesias in the left leg but no weakness.  No gait disturbance.  He denies falls or known trauma.  He denies fevers or chills, IV drug use, blood thinners, personal history of cancer.  Patient also complains of left upper quadrant and epigastric pain.  He reports it is sharp and improved after eating.  He reports sometimes it goes to his back but not always.  Patient reports he often has associated nausea but no vomiting or diarrhea.  He denies other abdominal pain.  He reports his pain does not prevent him from going about his daily life.  He is unsure if he pulled an abdominal muscle but movement does not seem to make his pain worse.  No treatments prior to arrival.  Patient denies fever, chills, vomiting, diarrhea, weakness, dizziness, syncope.  He denies known COVID contacts.       The history is provided by the patient and medical records. No language interpreter was used.    History reviewed. No  pertinent past medical history.  There are no active problems to display for this patient.   History reviewed. No pertinent surgical history.      Home Medications    Prior to Admission medications   Medication Sig Start Date End Date Taking? Authorizing Provider  dicyclomine (BENTYL) 20 MG tablet Take 1 tablet (20 mg total) by mouth 2 (two) times daily as needed for spasms. 05/13/19   Julianne Rice, MD  famotidine (PEPCID) 20 MG tablet Take 1 tablet (20 mg total) by mouth 2 (two) times daily for 15 days. 08/29/19 09/13/19  Mayzee Reichenbach, Jarrett Soho, PA-C  methocarbamol (ROBAXIN) 500 MG tablet Take 1 tablet (500 mg total) by mouth 2 (two) times daily. 08/29/19   Dallon Dacosta, Jarrett Soho, PA-C  omeprazole (PRILOSEC) 20 MG capsule Take 1 capsule (20 mg total) by mouth daily. 08/29/19   Calley Drenning, Jarrett Soho, PA-C  predniSONE (DELTASONE) 20 MG tablet 3 tabs po daily x 3 days, then 2 tabs x 3 days, then 1.5 tabs x 3 days, then 1 tab x 3 days, then 0.5 tabs x 3 days 08/29/19   Darene Nappi, Jarrett Soho, PA-C    Family History Family History  Problem Relation Age of Onset   Healthy Mother    Diabetes Father     Social History Social History   Tobacco Use   Smoking status: Never Smoker   Smokeless tobacco: Never Used  Substance Use  Topics   Alcohol use: Yes    Frequency: Never    Comment: Social   Drug use: Never     Allergies   Patient has no known allergies.   Review of Systems Review of Systems  Constitutional: Negative for appetite change, diaphoresis, fatigue, fever and unexpected weight change.  HENT: Negative for mouth sores.   Eyes: Negative for visual disturbance.  Respiratory: Negative for cough, chest tightness, shortness of breath and wheezing.   Cardiovascular: Negative for chest pain.  Gastrointestinal: Positive for abdominal pain (LUQ). Negative for constipation, diarrhea, nausea and vomiting.  Endocrine: Negative for polydipsia, polyphagia and polyuria.    Genitourinary: Negative for dysuria, frequency, hematuria and urgency.  Musculoskeletal: Negative for back pain and neck stiffness.  Skin: Negative for rash.  Allergic/Immunologic: Negative for immunocompromised state.  Neurological: Positive for numbness ( paresthesias). Negative for syncope, light-headedness and headaches.  Hematological: Does not bruise/bleed easily.  Psychiatric/Behavioral: Negative for sleep disturbance. The patient is not nervous/anxious.      Physical Exam Updated Vital Signs BP (!) 154/93 (BP Location: Right Arm)    Pulse 98    Temp 98.3 F (36.8 C) (Oral)    Resp 16    Ht '6\' 3"'  (1.905 m)    Wt 86.2 kg    SpO2 98%    BMI 23.75 kg/m   Physical Exam Vitals signs and nursing note reviewed.  Constitutional:      General: He is not in acute distress.    Appearance: He is not diaphoretic.  HENT:     Head: Normocephalic.  Eyes:     General: No scleral icterus.    Conjunctiva/sclera: Conjunctivae normal.  Neck:     Musculoskeletal: Normal range of motion and neck supple. Normal range of motion. Muscular tenderness present. No neck rigidity or pain with movement.   Cardiovascular:     Rate and Rhythm: Normal rate and regular rhythm.     Pulses: Normal pulses.          Radial pulses are 2+ on the right side and 2+ on the left side.  Pulmonary:     Effort: No tachypnea, accessory muscle usage, prolonged expiration, respiratory distress or retractions.     Breath sounds: No stridor.     Comments: Equal chest rise. No increased work of breathing. Abdominal:     General: There is no distension.     Palpations: Abdomen is soft.     Tenderness: There is abdominal tenderness in the epigastric area and left upper quadrant. There is no right CVA tenderness, left CVA tenderness, guarding or rebound.  Musculoskeletal:     Right shoulder: Normal.     Left shoulder: Normal.     Right elbow: Normal.    Left elbow: Normal.     Right wrist: Normal.     Left wrist:  Normal.     Right hip: Normal.     Left hip: Normal.     Right knee: Normal.     Left knee: Normal.     Right ankle: Normal.     Left ankle: Normal.     Thoracic back: Normal.     Lumbar back: Normal.     Comments: Moves all extremities equally and without difficulty.  Skin:    General: Skin is warm and dry.     Capillary Refill: Capillary refill takes less than 2 seconds.  Neurological:     Mental Status: He is alert.     GCS: GCS eye subscore  is 4. GCS verbal subscore is 5. GCS motor subscore is 6.     Comments: Speech is clear and goal oriented. Strength 5/5 in the Bilateral upper and lower extremities Sensation intact to normal touch throughout the upper and lower extremities.  Psychiatric:        Mood and Affect: Mood normal.      ED Treatments / Results  Labs (all labs ordered are listed, but only abnormal results are displayed) Labs Reviewed  COMPREHENSIVE METABOLIC PANEL - Abnormal; Notable for the following components:      Result Value   Total Protein 8.2 (*)    All other components within normal limits  URINALYSIS, ROUTINE W REFLEX MICROSCOPIC - Abnormal; Notable for the following components:   APPearance TURBID (*)    All other components within normal limits  CBC WITH DIFFERENTIAL/PLATELET  LIPASE, BLOOD     Procedures Procedures (including critical care time)  Medications Ordered in ED Medications  alum & mag hydroxide-simeth (MAALOX/MYLANTA) 200-200-20 MG/5ML suspension 30 mL (30 mLs Oral Given 08/29/19 0317)     Initial Impression / Assessment and Plan / ED Course  I have reviewed the triage vital signs and the nursing notes.  Pertinent labs & imaging results that were available during my care of the patient were reviewed by me and considered in my medical decision making (see chart for details).         Patient presents today with several concerns.  Initially he discusses paresthesias of the left upper extremity.  On exam this appears to be  a cervical radiculopathy as he does have some neck pain and this is reproducible with palpation of the paraspinal muscles of the cervical spine.  No weakness or objective numbness.  No slurred speech.  No evidence of CVA.  Patient denies trauma, IV drug use, history of cancer, anticoagulation.  Most likely to be osteomyelitis, epidural abscess, tumor.  No neurologic deficit on exam.  Will give course of prednisone and muscle relaxers.  Patient is to have close follow-up with orthopedics for this.  Patient also endorsing left upper quadrant and epigastric abdominal pain for several days.  Improves after eating.  Suspect gastritis versus peptic ulcer disease.  Labs are reassuring.  No evidence of pancreatitis.  Patient denies regular alcohol consumption.  No elevation in transaminases or alk phos.  Highly doubt cholecystitis.  No CVA tenderness.  No hematuria to suggest nephrolithiasis or renal colic.  Will give Pepcid and omeprazole.  Discussed diet for gastritis/GERD/peptic ulcer disease.  Patient will need close primary care follow-up for this.  Discussed red flags and reasons to return immediately to the emergency department for both complaints.  Also discussed treatment course.  Patient states understanding and is in agreement with the plan.  Questions answered.  Final Clinical Impressions(s) / ED Diagnoses   Final diagnoses:  Cervical radiculopathy  Acute gastritis, presence of bleeding unspecified, unspecified gastritis type    ED Discharge Orders         Ordered    methocarbamol (ROBAXIN) 500 MG tablet  2 times daily     08/29/19 0441    predniSONE (DELTASONE) 20 MG tablet     08/29/19 0441    famotidine (PEPCID) 20 MG tablet  2 times daily     08/29/19 0441    omeprazole (PRILOSEC) 20 MG capsule  Daily     08/29/19 0441           Dahna Hattabaugh, Jarrett Soho, PA-C 08/29/19 0446    Cardama,  Grayce Sessions, MD 08/29/19 [phone]

## 2019-09-02 ENCOUNTER — Emergency Department (HOSPITAL_COMMUNITY)
Admission: EM | Admit: 2019-09-02 | Discharge: 2019-09-02 | Disposition: A | Discharge status: Home / Self Care | Payer: Self-pay | Attending: Emergency Medicine | Admitting: Emergency Medicine

## 2019-09-02 ENCOUNTER — Emergency Department (HOSPITAL_COMMUNITY): Payer: Self-pay

## 2019-09-02 ENCOUNTER — Encounter (HOSPITAL_COMMUNITY): Payer: Self-pay | Admitting: Family Medicine

## 2019-09-02 ENCOUNTER — Other Ambulatory Visit: Payer: Self-pay

## 2019-09-02 DIAGNOSIS — M542 Cervicalgia: Secondary | ICD-10-CM | POA: Insufficient documentation

## 2019-09-02 DIAGNOSIS — Z79899 Other long term (current) drug therapy: Secondary | ICD-10-CM | POA: Insufficient documentation

## 2019-09-02 NOTE — ED Triage Notes (Signed)
Patient is complaining of left side neck pain that has intermittent numbness and tingling. Patient was seen on Friday at [facility] for the same symptoms. Patient was able to ambulate to triage.

## 2019-09-02 NOTE — Discharge Instructions (Signed)
Your x-ray today is negative.  Continue the Robaxin previously prescribed to you for management of pain and muscle spasms.  You can take this with 600 mg ibuprofen every 6 hours for pain.  Continue alternation of ice and heat.  Follow-up with a primary care doctor.

## 2019-09-03 NOTE — ED Provider Notes (Signed)
[facility]-EMERGENCY DEPT Provider Note   CSN: [id number] Arrival date & time: 09/02/19  0040     History   Chief Complaint Chief Complaint  Patient presents with  . Neck Pain    HPI Jay Wang is a 24 y.o. male.     24 year old male presents to the emergency department for evaluation of neck pain.  He has felt intermittent pain along the left side of his neck with spasms since he was last evaluated in the ED.  He became concerned today when his pain was associated with subjective difficulty lifting his legs.  He has not had any problems with ambulation and denies any numbness or paresthesias.  No bowel or bladder incontinence.  Spasms of the neck seem to also be migratory; states that he will sometimes feel similar sensations in his arm or back.  He has no history of trauma or injury.  No associated fevers, cancer, IV drug use.  Has been applying heat and using the muscle relaxers previously prescribed to him.  The history is provided by the patient. No language interpreter was used.  Neck Pain   History reviewed. No pertinent past medical history.  There are no active problems to display for this patient.   History reviewed. No pertinent surgical history.      Home Medications    Prior to Admission medications   Medication Sig Start Date End Date Taking? Authorizing Provider  dicyclomine (BENTYL) 20 MG tablet Take 1 tablet (20 mg total) by mouth 2 (two) times daily as needed for spasms. 05/13/19   Loren RacerYelverton, David, MD  famotidine (PEPCID) 20 MG tablet Take 1 tablet (20 mg total) by mouth 2 (two) times daily for 15 days. 08/29/19 09/13/19  Muthersbaugh, Dahlia ClientHannah, PA-C  methocarbamol (ROBAXIN) 500 MG tablet Take 1 tablet (500 mg total) by mouth 2 (two) times daily. 08/29/19   Muthersbaugh, Dahlia ClientHannah, PA-C  omeprazole (PRILOSEC) 20 MG capsule Take 1 capsule (20 mg total) by mouth daily. 08/29/19   Muthersbaugh, Dahlia ClientHannah, PA-C  predniSONE (DELTASONE) 20  MG tablet 3 tabs po daily x 3 days, then 2 tabs x 3 days, then 1.5 tabs x 3 days, then 1 tab x 3 days, then 0.5 tabs x 3 days 08/29/19   Muthersbaugh, Dahlia ClientHannah, PA-C    Family History Family History  Problem Relation Age of Onset  . Healthy Mother   . Diabetes Father     Social History Social History   Tobacco Use  . Smoking status: Never Smoker  . Smokeless tobacco: Never Used  Substance Use Topics  . Alcohol use: Yes    Frequency: Never  . Drug use: Never     Allergies   Patient has no known allergies.   Review of Systems Review of Systems  Musculoskeletal: Positive for neck pain.  Ten systems reviewed and are negative for acute change, except as noted in the HPI.    Physical Exam Updated Vital Signs BP (!) 157/98   Pulse 73   Temp 98.3 F (36.8 C) (Oral)   Resp 17   Ht 6\' 3"  (1.905 m)   Wt 86.2 kg   SpO2 100%   BMI 23.75 kg/m   Physical Exam Vitals signs and nursing note reviewed.  Constitutional:      General: He is not in acute distress.    Appearance: He is well-developed. He is not diaphoretic.     Comments: Nontoxic appearing and in NAD  HENT:     Head: Normocephalic  and atraumatic.  Eyes:     General: No scleral icterus.    Conjunctiva/sclera: Conjunctivae normal.  Neck:     Musculoskeletal: Normal range of motion.     Comments: No TTP to the cervical midline. No bony deformities, step offs, crepitus. Pulmonary:     Effort: Pulmonary effort is normal. No respiratory distress.     Comments: Respirations even and unlabored Musculoskeletal: Normal range of motion.  Skin:    General: Skin is warm and dry.     Coloration: Skin is not pale.     Findings: No erythema or rash.  Neurological:     General: No focal deficit present.     Mental Status: He is alert and oriented to person, place, and time.     Coordination: Coordination normal.     Comments: GCS 15.Patient has equal grip strength bilaterally with 5/5 strength against resistance in all  major muscle groups bilaterally. Sensation to light touch intact. Patient ambulatory with steady gait.  Psychiatric:        Behavior: Behavior normal.      ED Treatments / Results  Labs (all labs ordered are listed, but only abnormal results are displayed) Labs Reviewed - No data to display  EKG None  Radiology Dg Cervical Spine Complete  Result Date: 09/02/2019 CLINICAL DATA:  Neck pain, no injury, left side with radiation to upper extremity EXAM: CERVICAL SPINE - COMPLETE 4+ VIEW COMPARISON:  None. FINDINGS: There is no evidence of cervical spine fracture or prevertebral soft tissue swelling. Alignment is normal. No radiographically evident spinal canal or foraminal stenosis. No other significant bone abnormalities are identified. Cervical soft tissues are unremarkable. The airway is patent. No acute abnormality in the upper chest or imaged lung apices. IMPRESSION: Negative cervical spine radiographs. Electronically Signed   By: Lovena Le M.D.   On: 09/02/2019 05:55    Procedures Procedures (including critical care time)  Medications Ordered in ED Medications - No data to display   Initial Impression / Assessment and Plan / ED Course  I have reviewed the triage vital signs and the nursing notes.  Pertinent labs & imaging results that were available during my care of the patient were reviewed by me and considered in my medical decision making (see chart for details).        24 year old male presenting for persistent neck pain.  Was evaluated for this a few days ago.  Did not undergo imaging.  X-ray today does not show any acute bony pathology.  No fracture or dislocation.  He is neurovascularly intact with preserved strength.  Ambulates without difficulty.  No red flags or signs concerning for cauda equina or other spinal cord compression.  Have continued to advise supportive treatments.  Return precautions discussed and provided. Patient discharged in stable condition with  no unaddressed concerns.   Final Clinical Impressions(s) / ED Diagnoses   Final diagnoses:  Neck pain on left side    ED Discharge Orders    None       Antonietta Breach, PA-C 95/09/32 [id number]    Delora Fuel, MD 45/80/99 [phone]

## 2020-01-29 ENCOUNTER — Other Ambulatory Visit: Payer: Self-pay

## 2020-02-01 ENCOUNTER — Other Ambulatory Visit: Payer: Self-pay | Admitting: Family

## 2020-02-01 DIAGNOSIS — N5082 Scrotal pain: Secondary | ICD-10-CM

## 2020-02-08 ENCOUNTER — Other Ambulatory Visit: Payer: Self-pay | Admitting: Physician Assistant

## 2020-02-08 ENCOUNTER — Ambulatory Visit
Admission: RE | Admit: 2020-02-08 | Discharge: 2020-02-08 | Disposition: A | Discharge status: Home / Self Care | Payer: BC Managed Care – PPO | Source: Ambulatory Visit | Attending: Family | Admitting: Family

## 2020-02-08 DIAGNOSIS — N5082 Scrotal pain: Secondary | ICD-10-CM

## 2020-09-24 IMAGING — US US SCROTUM W/ DOPPLER COMPLETE
1 series · 14 of 25 positions shown · non-contrast
Comparison: None.

CLINICAL DATA: Pain for 3 months

EXAM:
SCROTAL ULTRASOUND
DOPPLER ULTRASOUND OF THE TESTICLES
TECHNIQUE: Complete ultrasound examination of the testicles, epididymis, and
other scrotal structures was performed. Color and spectral Doppler
ultrasound were also utilized to evaluate blood flow to the
testicles.

[Series 1: us scrotum w/ doppler complete · 0.08mm/px · 14 of 51 slices shown]
[im 1/51]
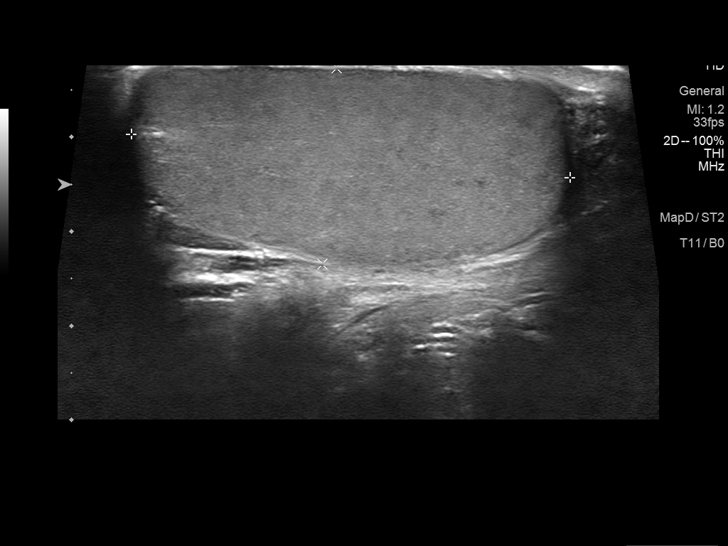
[im 5/51]
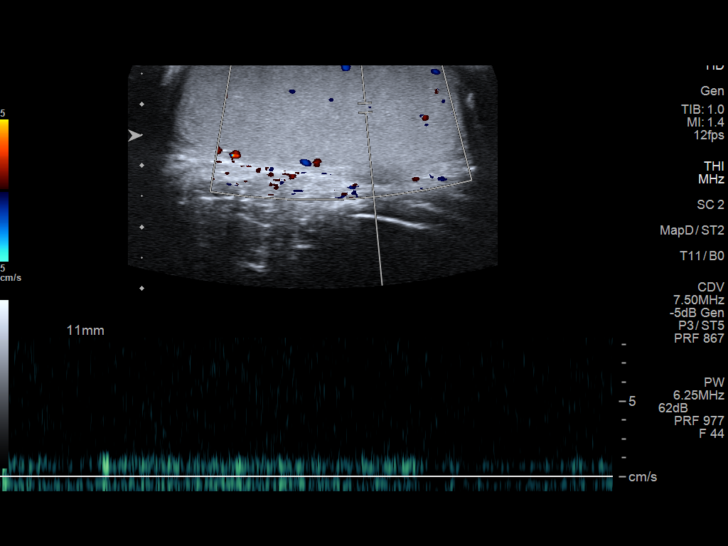
[im 9/51]
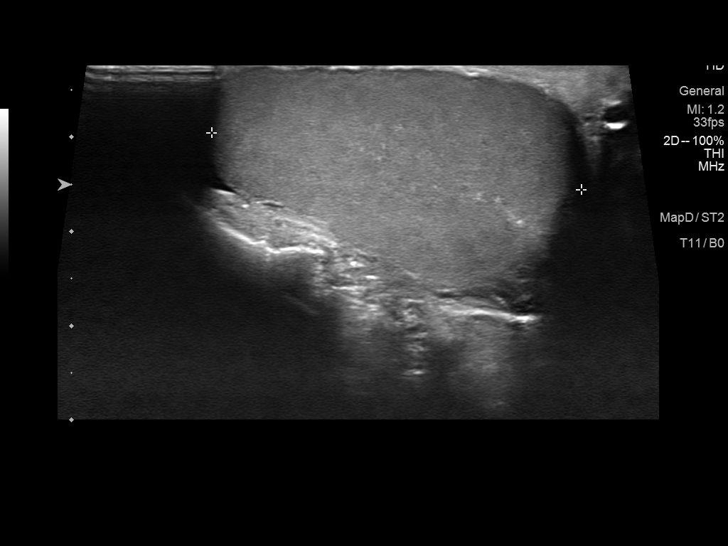
[im 13/51]
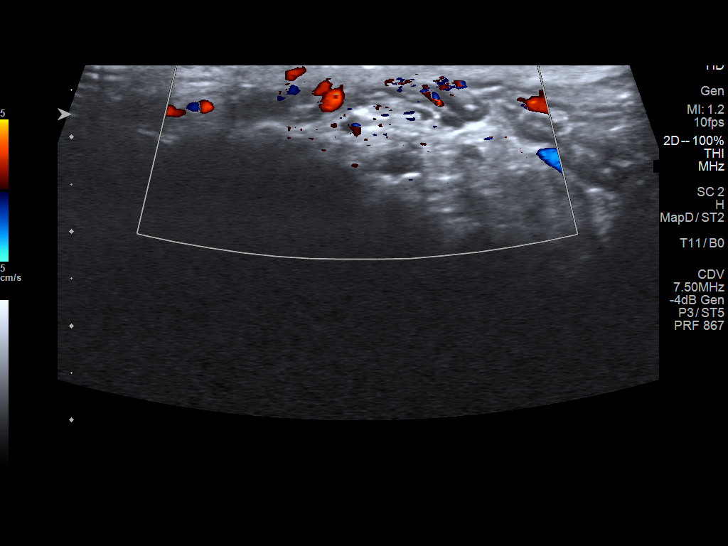
[im 17/51]
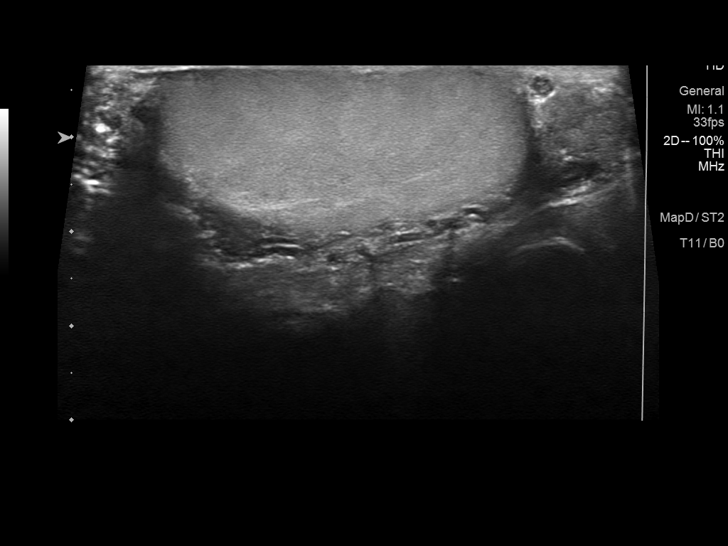
[im 19/51]
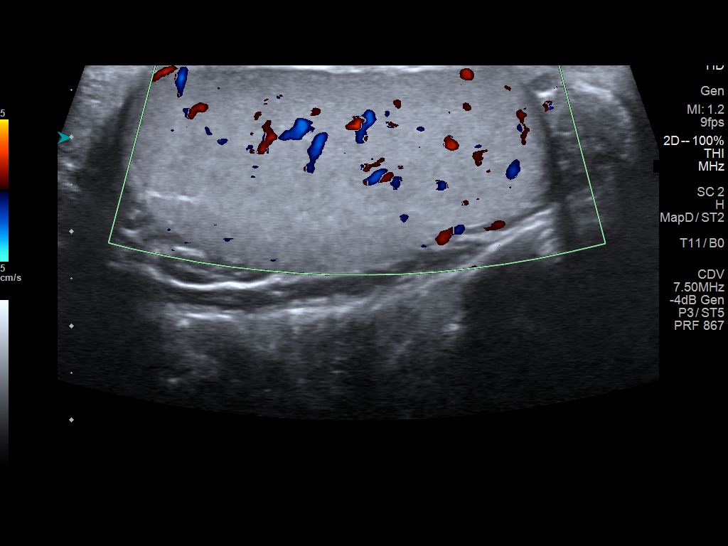
[im 23/51]
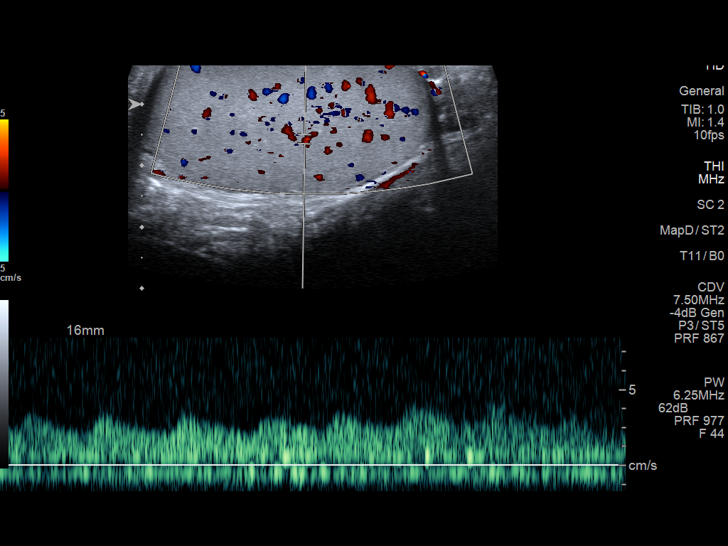
[im 28/51]
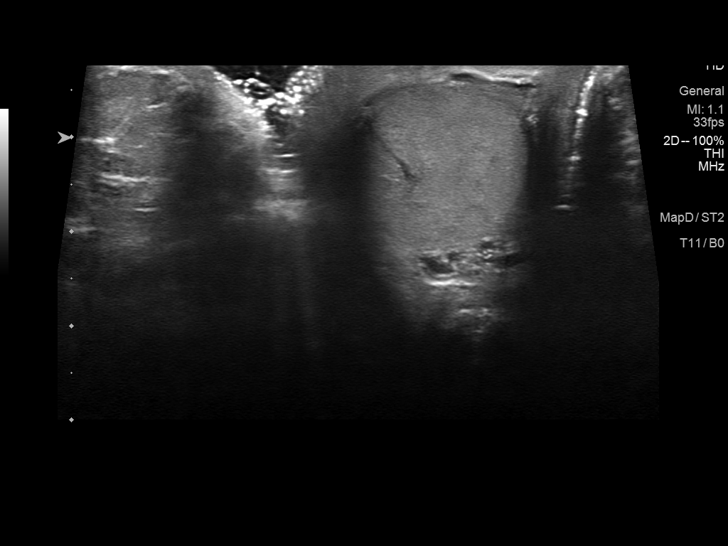
[im 32/51]
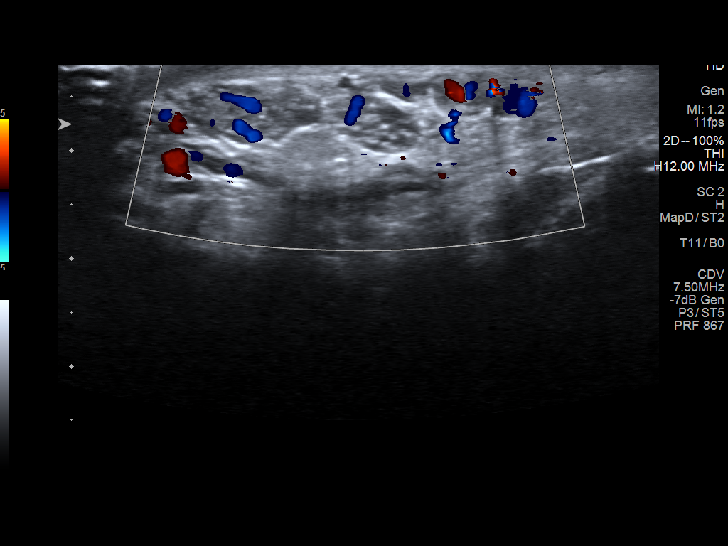
[im 34/51]
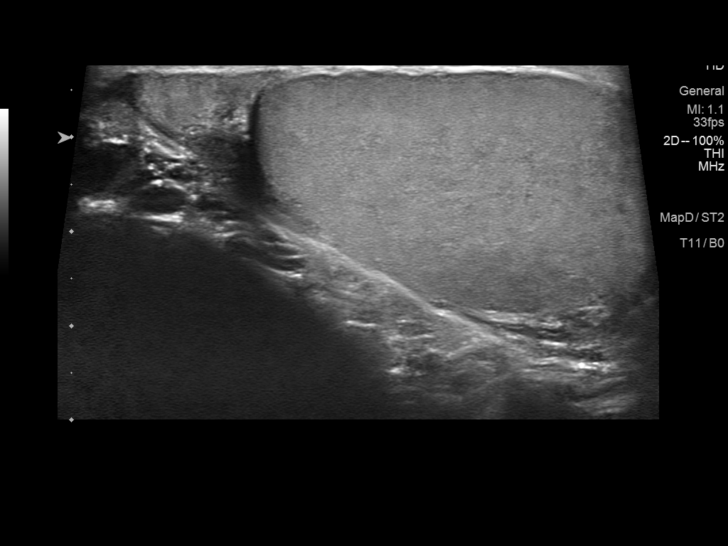
[im 38/51]
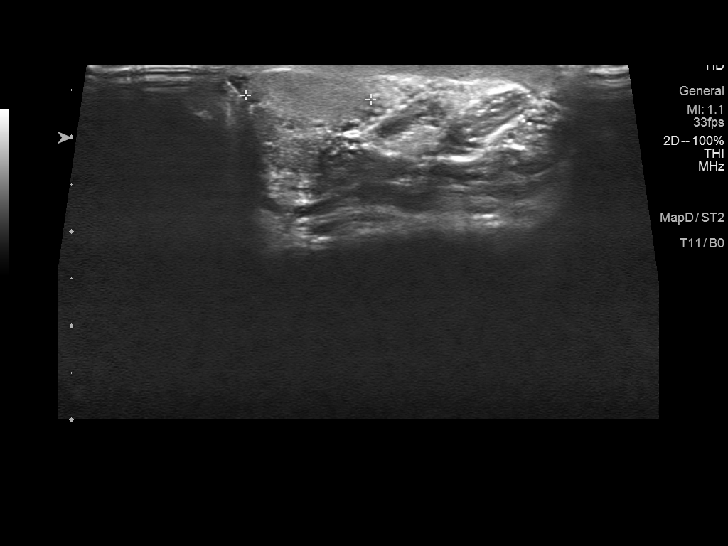
[im 42/51]
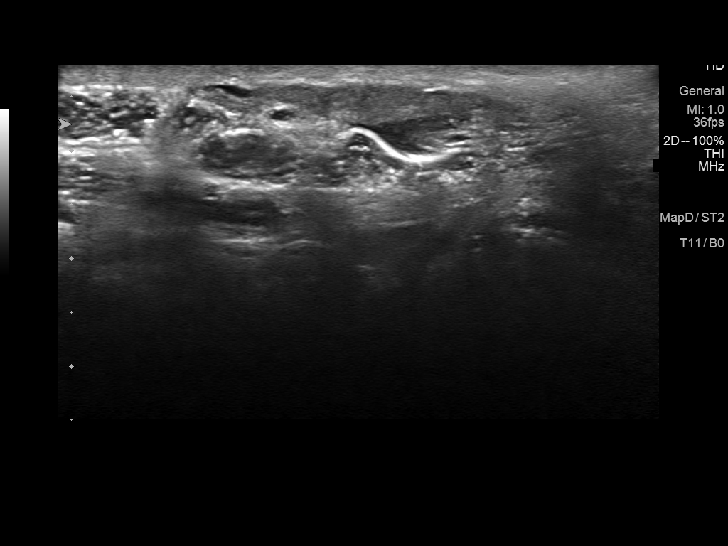
[im 46/51]
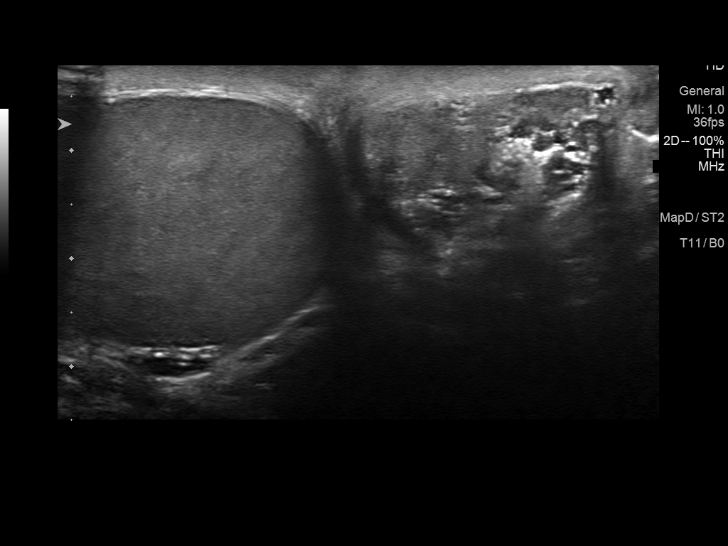
[im 51/51]
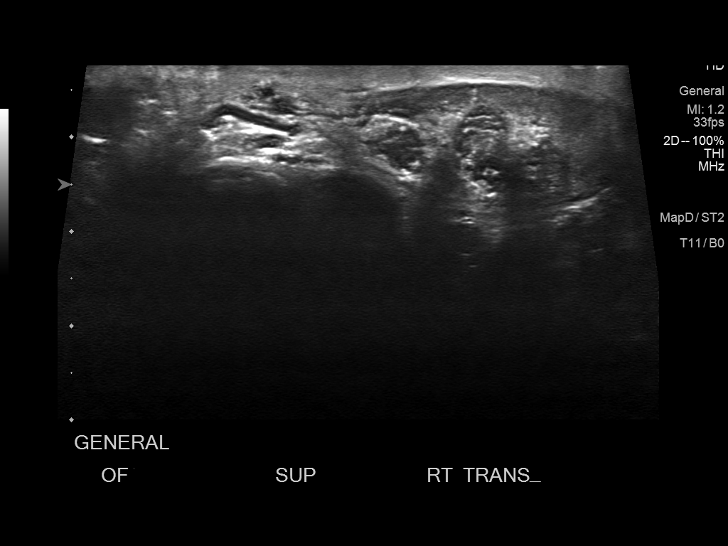

[14 of 25 positions shown; findings below may reference images not displayed]

FINDINGS: Right testicle

Measurements: 4.7 x 2.1 x 4.0 cm. No mass or microlithiasis
visualized. No abnormalities along the right superior scrotum at the
site of maximal tenderness.

Left testicle

Measurements: 4.8 x 2.4 by 2.6 cm. No mass or microlithiasis
visualized.

Right epididymis:  Normal in size and appearance.

Left epididymis:  Normal in size and appearance.

Hydrocele:  None visualized.

Varicocele:  None visualized.

Pulsed Doppler interrogation of both testes demonstrates normal low
resistance arterial and venous waveforms bilaterally.
IMPRESSION: 1. Unremarkable testicular ultrasound.

## 2020-12-24 ENCOUNTER — Emergency Department (HOSPITAL_COMMUNITY): Payer: BC Managed Care – PPO

## 2020-12-24 ENCOUNTER — Emergency Department (HOSPITAL_COMMUNITY)
Admission: EM | Admit: 2020-12-24 | Discharge: 2020-12-24 | Disposition: A | Discharge status: Home / Self Care | Payer: BC Managed Care – PPO | Attending: Emergency Medicine | Admitting: Emergency Medicine

## 2020-12-24 ENCOUNTER — Encounter (HOSPITAL_COMMUNITY): Payer: Self-pay | Admitting: Emergency Medicine

## 2020-12-24 ENCOUNTER — Other Ambulatory Visit: Payer: Self-pay

## 2020-12-24 DIAGNOSIS — R509 Fever, unspecified: Secondary | ICD-10-CM | POA: Diagnosis present

## 2020-12-24 DIAGNOSIS — U071 COVID-19: Secondary | ICD-10-CM | POA: Diagnosis not present

## 2020-12-24 LAB — RESP PANEL BY RT-PCR (FLU A&B, COVID) ARPGX2
Influenza A by PCR: NEGATIVE
Influenza B by PCR: NEGATIVE
SARS Coronavirus 2 by RT PCR: POSITIVE — AB

## 2020-12-24 MED ORDER — FLUTICASONE PROPIONATE 50 MCG/ACT NA SUSP: 1.0000 | Freq: Every day | NASAL | 0 refills | Status: AC

## 2020-12-24 MED ORDER — IBUPROFEN 800 MG PO TABS
800.0000 mg | ORAL_TABLET | Freq: Once | ORAL | Status: AC
  Administered 2020-12-24: 800 mg via ORAL
  Filled 2020-12-24: qty 1

## 2020-12-24 NOTE — ED Provider Notes (Signed)
[facility]-EMERGENCY DEPT Provider Note   CSN: [id number] Arrival date & time: 12/24/20  0350     History Chief Complaint  Patient presents with   COVID symptoms    Jay Wang is a 25 y.o. male presenting for evaluation of Covid symptoms.  Patient states for the past 6 days, he has not been feeling well.  He reports subjective fevers and chills, nasal congestion, cough, loss of taste and smell, generalized weakness.  He recently spent time with several folks who were ill, and after spending time with them, they tested positive for Covid.  He has been taking Robitussin with minimal improvement of symptoms.  He reports shortness of breath tonight, however then states it was associated with nasal congestion while he was asleep.  He denies chest pain, nausea, vomiting, abdominal pain, urinary symptoms, normal bowel movements.  He has no medical problems, takes no medications daily.  No history of asthma or COPD.  He does not smoke cigarettes.  He is not vaccinated for Covid or flu  HPI     History reviewed. No pertinent past medical history.  There are no problems to display for this patient.   History reviewed. No pertinent surgical history.     Family History  Problem Relation Age of Onset   Healthy Mother    Diabetes Father     Social History   Tobacco Use   Smoking status: Never Smoker   Smokeless tobacco: Never Used  Building services engineer Use: Never used  Substance Use Topics   Alcohol use: Yes   Drug use: Never    Home Medications Prior to Admission medications   Medication Sig Start Date End Date Taking? Authorizing Provider  dicyclomine (BENTYL) 20 MG tablet Take 1 tablet (20 mg total) by mouth 2 (two) times daily as needed for spasms. 05/13/19   Loren Racer, MD  famotidine (PEPCID) 20 MG tablet Take 1 tablet (20 mg total) by mouth 2 (two) times daily for 15 days. 08/29/19 09/13/19  Muthersbaugh, Dahlia Client, PA-C   methocarbamol (ROBAXIN) 500 MG tablet Take 1 tablet (500 mg total) by mouth 2 (two) times daily. 08/29/19   Muthersbaugh, Dahlia Client, PA-C  omeprazole (PRILOSEC) 20 MG capsule Take 1 capsule (20 mg total) by mouth daily. 08/29/19   Muthersbaugh, Dahlia Client, PA-C  predniSONE (DELTASONE) 20 MG tablet 3 tabs po daily x 3 days, then 2 tabs x 3 days, then 1.5 tabs x 3 days, then 1 tab x 3 days, then 0.5 tabs x 3 days 08/29/19   Muthersbaugh, Dahlia Client, PA-C    Allergies    Patient has no known allergies.  Review of Systems   Review of Systems  Constitutional: Positive for fever (subjective).  HENT: Positive for congestion.        Loss of taste and smell  Respiratory: Positive for cough and shortness of breath.   Musculoskeletal: Positive for myalgias.  Neurological: Positive for weakness.  All other systems reviewed and are negative.   Physical Exam Updated Vital Signs BP (!) 122/98    Pulse 84    Temp 98.1 F (36.7 C) (Oral)    Resp 18    SpO2 99%   Physical Exam Vitals and nursing note reviewed.  Constitutional:      General: He is not in acute distress.    Appearance: He is well-developed and well-nourished.     Comments: Appears nontoxic  HENT:     Head: Normocephalic and atraumatic.  Eyes:  Extraocular Movements: Extraocular movements intact and EOM normal.     Conjunctiva/sclera: Conjunctivae normal.     Pupils: Pupils are equal, round, and reactive to light.  Cardiovascular:     Rate and Rhythm: Normal rate and regular rhythm.     Pulses: Normal pulses and intact distal pulses.  Pulmonary:     Effort: Pulmonary effort is normal. No respiratory distress.     Breath sounds: Normal breath sounds. No wheezing.     Comments: Speaking in full sentences.  Clear lung sounds in all fields.  Sats stable on room air. Abdominal:     General: There is no distension.     Palpations: Abdomen is soft. There is no mass.     Tenderness: There is no abdominal tenderness. There is no guarding or  rebound.  Musculoskeletal:        General: Normal range of motion.     Cervical back: Normal range of motion and neck supple.  Skin:    General: Skin is warm and dry.     Capillary Refill: Capillary refill takes less than 2 seconds.  Neurological:     Mental Status: He is alert and oriented to person, place, and time.  Psychiatric:        Mood and Affect: Mood and affect normal.     ED Results / Procedures / Treatments   Labs (all labs ordered are listed, but only abnormal results are displayed) Labs Reviewed  RESP PANEL BY RT-PCR (FLU A&B, COVID) ARPGX2 - Abnormal; Notable for the following components:      Result Value   SARS Coronavirus 2 by RT PCR POSITIVE (*)    All other components within normal limits    EKG EKG Interpretation  Date/Time:  Saturday December 24 2020 05:55:30 EST Ventricular Rate:  69 PR Interval:    QRS Duration: 87 QT Interval:  384 QTC Calculation: 412 R Axis:   106 Text Interpretation: Sinus rhythm Borderline right axis deviation Borderline T abnormalities, inferior leads No significant change since last tracing 12 Jun 2019 Confirmed by Devoria Albe ([id number]) on 12/24/2020 6:02:51 AM   Radiology DG Chest 2 View  Result Date: 12/24/2020 CLINICAL DATA:  Initial evaluation for acute shortness of breath, COVID exposure. EXAM: CHEST - 2 VIEW COMPARISON:  Prior radiograph from 06/12/2019. FINDINGS: The cardiac and mediastinal silhouettes are stable in size and contour, and remain within normal limits. The lungs are normally inflated. No airspace consolidation, pleural effusion, or pulmonary edema. No pneumothorax. No acute osseous abnormality. IMPRESSION: No radiographic evidence for active cardiopulmonary disease. Electronically Signed   By: Rise Mu M.D.   On: 12/24/2020 05:22    Procedures Procedures (including critical care time)  Medications Ordered in ED Medications  ibuprofen (ADVIL) tablet 800 mg (800 mg Oral Given 12/24/20 0549)     ED Course  I have reviewed the triage vital signs and the nursing notes.  Pertinent labs & imaging results that were available during my care of the patient were reviewed by me and considered in my medical decision making (see chart for details).    MDM Rules/Calculators/A&P                          Patient resenting for evaluation of 6-day history of Covid symptoms.  On exam, patient appears nontoxic.  He reports some shortness of breath tonight, however on further investigation that was related to nasal congestion while asleep.  Pulmonary exam is overall  reassuring.  Chest x-ray obtained from triage read interpreted by me, no pneumonia, pnx, effusion.  Covid test is pending.  Will have patient ambulate to ensure no hypoxia.  Discussed with patient likely Covid diagnosis and symptomatic treatment.  Covid test is positive.  Patient ambulated without hypoxia.  At this time, patient appears safe for discharge.  Return precautions given.  Patient states he understands and agrees to plan.  Final Clinical Impression(s) / ED Diagnoses Final diagnoses:  COVID-19    Rx / DC Orders ED Discharge Orders    None       Alveria Apley, PA-C 12/24/20 [id number]    Devoria Albe, MD 12/24/20 [phone]

## 2020-12-24 NOTE — ED Notes (Signed)
Patient ambulated in room and maintained O2 saturation of 98%.

## 2020-12-24 NOTE — ED Triage Notes (Signed)
Patient states he began having COVID symptoms on Monday. Patient was swabbed at [facility] on 12/23 but has not received the result when he checked before arrival. Patient states shortness of breath that woke him up out of sleep. Patient states subjective fevers at home and a small, non-productive cough, malaise.

## 2020-12-24 NOTE — Discharge Instructions (Signed)
Your Covid test was positive, this is the cause of your symptoms. Your chest x-ray was clear, there are no signs of pneumonia. This is a viral illness.  You should treat this symptomatically. Use Tylenol and/or ibuprofen as needed for fever, headache, body aches. Make sure stay well-hydrated water. Continue using Robitussin to help with cough. You may use a nasal spray to help with nasal congestion, this may help with your shortness of breath at night. Return to the emergency room if you develop increased difficulty breathing, severe chest pain, any new, worsening, or concerning symptoms.
